# Patient Record
Sex: Male | Born: 1980 | Race: Black or African American | Hispanic: No | Marital: Married | Smoking: Current every day smoker
Health system: Southern US, Community
[De-identification: ages and names within clinical notes are randomized; demographics above are authoritative.]

---

## 2006-11-02 ENCOUNTER — Emergency Department: Payer: Self-pay | Admitting: Emergency Medicine

## 2007-07-20 ENCOUNTER — Emergency Department: Payer: Self-pay | Admitting: Emergency Medicine

## 2009-06-07 ENCOUNTER — Emergency Department: Payer: Self-pay | Admitting: Emergency Medicine

## 2009-09-25 ENCOUNTER — Emergency Department: Payer: Self-pay | Admitting: Unknown Physician Specialty

## 2012-02-07 ENCOUNTER — Emergency Department: Payer: Self-pay | Admitting: Emergency Medicine

## 2012-02-09 ENCOUNTER — Emergency Department: Payer: Self-pay | Admitting: Internal Medicine

## 2012-08-29 ENCOUNTER — Emergency Department: Payer: Self-pay | Admitting: Emergency Medicine

## 2013-02-04 ENCOUNTER — Emergency Department: Payer: Self-pay | Admitting: Unknown Physician Specialty

## 2013-12-02 ENCOUNTER — Emergency Department: Payer: Self-pay | Admitting: Emergency Medicine

## 2014-10-21 ENCOUNTER — Emergency Department: Payer: Self-pay | Admitting: Emergency Medicine

## 2015-03-05 ENCOUNTER — Encounter: Payer: Self-pay | Admitting: Emergency Medicine

## 2015-03-05 ENCOUNTER — Emergency Department
Admission: EM | Admit: 2015-03-05 | Discharge: 2015-03-05 | Disposition: A | Discharge status: Home / Self Care | Payer: 59 | Attending: Emergency Medicine | Admitting: Emergency Medicine

## 2015-03-05 DIAGNOSIS — Z720 Tobacco use: Secondary | ICD-10-CM | POA: Diagnosis not present

## 2015-03-05 DIAGNOSIS — R42 Dizziness and giddiness: Secondary | ICD-10-CM | POA: Insufficient documentation

## 2015-03-05 DIAGNOSIS — R51 Headache: Secondary | ICD-10-CM | POA: Insufficient documentation

## 2015-03-05 DIAGNOSIS — R519 Headache, unspecified: Secondary | ICD-10-CM

## 2015-03-05 MED ORDER — IBUPROFEN 800 MG PO TABS
800.0000 mg | ORAL_TABLET | Freq: Once | ORAL | Status: AC
  Administered 2015-03-05: 800 mg via ORAL

## 2015-03-05 MED ORDER — BUTALBITAL-APAP-CAFFEINE 50-325-40 MG PO TABS: 1.0000 | ORAL_TABLET | Freq: Four times a day (QID) | ORAL | Status: DC | PRN

## 2015-03-05 MED ORDER — IBUPROFEN 800 MG PO TABS
ORAL_TABLET | ORAL | Status: AC
  Administered 2015-03-05: 800 mg via ORAL
  Filled 2015-03-05: qty 1

## 2015-03-05 MED ORDER — TRAMADOL HCL 50 MG PO TABS
50.0000 mg | ORAL_TABLET | Freq: Once | ORAL | Status: AC
  Administered 2015-03-05: 50 mg via ORAL

## 2015-03-05 MED ORDER — TRAMADOL HCL 50 MG PO TABS
ORAL_TABLET | ORAL | Status: AC
  Administered 2015-03-05: 50 mg via ORAL
  Filled 2015-03-05: qty 1

## 2015-03-05 NOTE — ED Notes (Signed)
Patient states that he has had a headache times a week and half. Patient reports that he has taken over the counter medications with no improvement.

## 2015-03-05 NOTE — ED Provider Notes (Signed)
[facility] Emergency Department Provider Note  ____________________________________________  Time seen: Approximately 9:00 PM  I have reviewed the triage vital signs and the nursing notes.   HISTORY  Chief Complaint Headache    HPI Noah Lozano is a 34 y.o. male patient state he has had intermittent headache for over a week. Patient stated times the headache cause lightheadedness. Patient denies any nausea vomiting with this headache. He stated intermittent photosensitivity. Stated headache does not seem to be relieved for over-the-counter medication consisting of Aleve and Tylenol. Patient states the headache is on the right side radiating to the right occipital area. Denies any acute vision changes. Patient state he has been able to work with a headache. Patient denies any URI signs and symptoms he rate his pain as a 4/10 and described the headache as a pressure to dull.   No past medical history on file.  There are no active problems to display for this patient.   No past surgical history on file.  Current Outpatient Rx  Name  Route  Sig  Dispense  Refill  . butalbital-acetaminophen-caffeine (FIORICET) 50-325-40 MG per tablet   Oral   Take 1-2 tablets by mouth every 6 (six) hours as needed for headache.   20 tablet   0     Allergies Review of patient's allergies indicates no known allergies.  History reviewed. No pertinent family history.  Social History History  Substance Use Topics  . Smoking status: Current Every Day Smoker -- 0.50 packs/day for 10 years  . Smokeless tobacco: Not on file  . Alcohol Use: No    Review of Systems Constitutional: No fever/chills Eyes: No visual changes. ENT: No sore throat. Cardiovascular: Denies chest pain. Respiratory: Denies shortness of breath. Gastrointestinal: No abdominal pain.  No nausea, no vomiting.  No diarrhea.  No constipation. Genitourinary: Negative for dysuria. Musculoskeletal:  Negative for back pain. Skin: Negative for rash. Neurological: Positive headache without, focal weakness or numbness. { 10-point ROS otherwise negative.  ____________________________________________   PHYSICAL EXAM:  VITAL SIGNS: ED Triage Vitals  Enc Vitals Group     BP 03/05/15 2015 132/78 mmHg     Pulse Rate 03/05/15 2015 60     Resp 03/05/15 2015 18     Temp 03/05/15 2015 98.1 F (36.7 C)     Temp Source 03/05/15 2015 Oral     SpO2 03/05/15 2015 99 %     Weight 03/05/15 2015 195 lb (88.451 kg)     Height 03/05/15 2015  (1.727 m)     Head Cir --      Peak Flow --      Pain Score 03/05/15 2016 4     Pain Loc --      Pain Edu? --      Excl. in GC? --     Constitutional: Alert and oriented. Well appearing and in no acute distress. Eyes: Conjunctivae are normal. PERRL. EOMI. Head: Atraumatic. Nose: No congestion/rhinnorhea. Mouth/Throat: Mucous membranes are moist.  Oropharynx non-erythematous. Neck: No stridor.  No deformity for nuchal range of motion nontender palpation. Hematological/Lymphatic/Immunilogical: No cervical lymphadenopathy. Cardiovascular: Normal rate, regular rhythm. Grossly normal heart sounds.  Good peripheral circulation. Respiratory: Normal respiratory effort.  No retractions. Lungs CTAB. Gastrointestinal: Soft and nontender. No distention. No abdominal bruits. No CVA tenderness. Musculoskeletal: No lower extremity tenderness nor edema.  No joint effusions. Neurologic:  Normal speech and language. No gross focal neurologic deficits are appreciated. Speech is normal. No gait instability. Skin:  Skin is warm, dry and intact. No rash noted. Psychiatric: Mood and affect are normal. Speech and behavior are normal.  ____________________________________________   LABS (all labs ordered are listed, but only abnormal results are displayed)  Labs Reviewed - No data to  display ____________________________________________  EKG   ____________________________________________  RADIOLOGY   ____________________________________________   PROCEDURES  Procedure(s) performed: None  Critical Care performed: No  ____________________________________________   INITIAL IMPRESSION / ASSESSMENT AND PLAN / ED COURSE  Pertinent labs & imaging results that were available during my care of the patient were reviewed by me and considered in my medical decision making (see chart for details).  Nonspecific headache ____________________________________________   FINAL CLINICAL IMPRESSION(S) / ED DIAGNOSES  Final diagnoses:  Headache in back of head      Joni ReiningRonald K Smith, PA-C 03/05/15 2110  Phineas SemenGraydon Goodman, MD 03/05/15 2144

## 2015-03-05 NOTE — ED Notes (Signed)
Denies nausea or vomiting with headache.  Hurts so bad causes lightheadness. Photosensitivity present. Taken Ibuprofen, Aleeve, Tylenol over the past week but no relief

## 2019-01-09 ENCOUNTER — Other Ambulatory Visit: Payer: Self-pay

## 2019-01-09 ENCOUNTER — Encounter: Payer: Self-pay | Admitting: Emergency Medicine

## 2019-01-09 ENCOUNTER — Emergency Department
Admission: EM | Admit: 2019-01-09 | Discharge: 2019-01-09 | Disposition: A | Discharge status: Home / Self Care | Payer: 59 | Attending: Student in an Organized Health Care Education/Training Program | Admitting: Student in an Organized Health Care Education/Training Program

## 2019-01-09 ENCOUNTER — Emergency Department: Payer: 59

## 2019-01-09 DIAGNOSIS — Y939 Activity, unspecified: Secondary | ICD-10-CM | POA: Diagnosis not present

## 2019-01-09 DIAGNOSIS — R51 Headache: Secondary | ICD-10-CM | POA: Diagnosis present

## 2019-01-09 DIAGNOSIS — S01112A Laceration without foreign body of left eyelid and periocular area, initial encounter: Secondary | ICD-10-CM | POA: Insufficient documentation

## 2019-01-09 DIAGNOSIS — Y929 Unspecified place or not applicable: Secondary | ICD-10-CM | POA: Insufficient documentation

## 2019-01-09 DIAGNOSIS — Z23 Encounter for immunization: Secondary | ICD-10-CM | POA: Insufficient documentation

## 2019-01-09 DIAGNOSIS — S0181XA Laceration without foreign body of other part of head, initial encounter: Secondary | ICD-10-CM

## 2019-01-09 DIAGNOSIS — F17210 Nicotine dependence, cigarettes, uncomplicated: Secondary | ICD-10-CM | POA: Insufficient documentation

## 2019-01-09 DIAGNOSIS — Y999 Unspecified external cause status: Secondary | ICD-10-CM | POA: Diagnosis not present

## 2019-01-09 DIAGNOSIS — Z79899 Other long term (current) drug therapy: Secondary | ICD-10-CM | POA: Diagnosis not present

## 2019-01-09 DIAGNOSIS — S0990XA Unspecified injury of head, initial encounter: Secondary | ICD-10-CM | POA: Insufficient documentation

## 2019-01-09 MED ORDER — BUPIVACAINE HCL (PF) 0.5 % IJ SOLN
30.0000 mL | Freq: Once | INTRAMUSCULAR | Status: AC
  Administered 2019-01-09: 30 mL
  Filled 2019-01-09: qty 30

## 2019-01-09 MED ORDER — LIDOCAINE-EPINEPHRINE-TETRACAINE (LET) SOLUTION
3.0000 mL | Freq: Once | NASAL | Status: AC
  Administered 2019-01-09: 3 mL via TOPICAL
  Filled 2019-01-09: qty 3

## 2019-01-09 MED ORDER — TETANUS-DIPHTH-ACELL PERTUSSIS 5-2.5-18.5 LF-MCG/0.5 IM SUSP
0.5000 mL | Freq: Once | INTRAMUSCULAR | Status: AC
  Administered 2019-01-09: 0.5 mL via INTRAMUSCULAR
  Filled 2019-01-09: qty 0.5

## 2019-01-09 MED ORDER — FLUORESCEIN SODIUM 1 MG OP STRP
1.0000 | ORAL_STRIP | Freq: Once | OPHTHALMIC | Status: AC
  Administered 2019-01-09: 1 via OPHTHALMIC
  Filled 2019-01-09: qty 1
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
## 2019-01-24 ENCOUNTER — Telehealth: Payer: 59 | Admitting: Nurse Practitioner

## 2019-01-24 DIAGNOSIS — Z7189 Other specified counseling: Secondary | ICD-10-CM

## 2019-01-24 DIAGNOSIS — R059 Cough, unspecified: Secondary | ICD-10-CM

## 2019-01-24 DIAGNOSIS — R05 Cough: Secondary | ICD-10-CM

## 2019-01-24 MED ORDER — BENZONATATE 100 MG PO CAPS: 100.0000 mg | ORAL_CAPSULE | Freq: Three times a day (TID) | ORAL | 0 refills | Status: DC | PRN

## 2019-01-24 NOTE — Progress Notes (Signed)
E-Visit for Corona Virus Screening  Based on your current symptoms, you may very well have the virus, however your symptoms are mild. Currently, not all patients are being tested. If the symptoms are mild and there is not a known exposure, performing the test is not indicated.  Coronavirus disease 2019 (COVID-19) is a respiratory illness that can spread from person to person. The virus that causes COVID-19 is a new virus that was first identified in the country of [place] but is now found in multiple other countries and has spread to the [place].  Symptoms associated with the virus are mild to severe fever, cough, and shortness of breath. There is currently no vaccine to protect against COVID-19, and there is no specific antiviral treatment for the virus.   To be considered HIGH RISK for Coronavirus (COVID-19), you have to meet the following criteria:  . Traveled to [place], [place], [place], [place] or [place]; or in the [place] to [place], [place], [place], or [place]; and have fever, cough, and shortness of breath within the last 2 weeks of travel OR  . Been in close contact with a person diagnosed with COVID-19 within the last 2 weeks and have fever, cough, and shortness of breath  . IF YOU DO NOT MEET THESE CRITERIA, YOU ARE CONSIDERED LOW RISK FOR COVID-19.   It is vitally important that if you feel that you have an infection such as this virus or any other virus that you stay home and away from places where you may spread it to others.  You should self-quarantine for 14 days if you have symptoms that could potentially be coronavirus and avoid contact with people age 65 and older.   You can use medication such as A prescription cough medication called Tessalon Perles 100 mg. You may take 1-2 capsules every 8 hours as needed for cough  You may also take acetaminophen (Tylenol) as needed for fever.   Reduce your risk of any infection by using the same precautions used for  avoiding the common cold or flu:  . Wash your hands often with soap and warm water for at least 20 seconds.  If soap and water are not readily available, use an alcohol-based hand sanitizer with at least 60% alcohol.  . If coughing or sneezing, cover your mouth and nose by coughing or sneezing into the elbow areas of your shirt or coat, into a tissue or into your sleeve (not your hands). . Avoid shaking hands with others and consider head nods or verbal greetings only. . Avoid touching your eyes, nose, or mouth with unwashed hands.  . Avoid close contact with people who are sick. . Avoid places or events with large numbers of people in one location, like concerts or sporting events. . Carefully consider travel plans you have or are making. . If you are planning any travel outside or inside the [place], visit the CDC's Travelers' Health webpage for the latest health notices. . If you have some symptoms but not all symptoms, continue to monitor at home and seek medical attention if your symptoms worsen. . If you are having a medical emergency, call 911.  HOME CARE . Only take medications as instructed by your medical team. . Drink plenty of fluids and get plenty of rest. . A steam or ultrasonic humidifier can help if you have congestion.   GET HELP RIGHT AWAY IF: . You develop worsening fever. . You become short of breath . You cough   up blood. . Your symptoms become more severe MAKE SURE YOU   Understand these instructions.  Will watch your condition.  Will get help right away if you are not doing well or get worse.  Your e-visit answers were reviewed by a board certified advanced clinical practitioner to complete your personal care plan.  Depending on the condition, your plan could have included both over the counter or prescription medications.  If there is a problem please reply once you have received a response from your provider. Your safety is important to us.  If you have drug  allergies check your prescription carefully.    You can use MyChart to ask questions about today's visit, request a non-urgent call back, or ask for a work or school excuse for 24 hours related to this e-Visit. If it has been greater than 24 hours you will need to follow up with your provider, or enter a new e-Visit to address those concerns. You will get an e-mail in the next two days asking about your experience.  I hope that your e-visit has been valuable and will speed your recovery. Thank you for using e-visits.   5-10 minutes spent reviewing and documenting in chart.  
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
## 2024-09-05 ENCOUNTER — Emergency Department
Admission: EM | Admit: 2024-09-05 | Discharge: 2024-09-05 | Disposition: A | Discharge status: Home / Self Care | Payer: Self-pay | Attending: Emergency Medicine | Admitting: Emergency Medicine

## 2024-09-05 ENCOUNTER — Encounter: Payer: Self-pay | Admitting: Emergency Medicine

## 2024-09-05 ENCOUNTER — Other Ambulatory Visit: Payer: Self-pay

## 2024-09-05 DIAGNOSIS — L02412 Cutaneous abscess of left axilla: Secondary | ICD-10-CM | POA: Insufficient documentation

## 2024-09-05 MED ORDER — LIDOCAINE-EPINEPHRINE 2 %-1:100000 IJ SOLN
20.0000 mL | Freq: Once | INTRAMUSCULAR | Status: AC
  Administered 2024-09-05: 20 mL via INTRADERMAL

## 2024-09-05 NOTE — Discharge Instructions (Signed)
 You may remove the wick in 2 days.  Keep the area clean, wash with soap and water after the wick is removed.  Please return for any new, worsening, or changing symptoms or other concerns.  It was a pleasure caring for you today.

## 2024-09-05 NOTE — ED Provider Notes (Signed)
 [facility] Provider Note    Event Date/Time   First MD Initiated Contact with Patient 09/05/24 0830     (approximate)   History   Abscess   HPI  Noah Lozano is a 43 y.o. male who presents with pain to his left axilla.  Patient reports that he had an abscess here that popped on its own in November, though became painful again last night.  No fevers or chills.  There are no active problems to display for this patient.         Physical Exam   Triage Vital Signs: ED Triage Vitals [09/05/24 0828]  Encounter Vitals Group     BP (!) 162/103     Girls Systolic BP Percentile      Girls Diastolic BP Percentile      Boys Systolic BP Percentile      Boys Diastolic BP Percentile      Pulse Rate 75     Resp 18     Temp 98 F (36.7 C)     Temp src      SpO2 97 %     Weight 190 lb (86.2 kg)     Height 5' 9 (1.753 m)     Head Circumference      Peak Flow      Pain Score 8     Pain Loc      Pain Education      Exclude from Growth Chart     Most recent vital signs: Vitals:   09/05/24 0828  BP: (!) 162/103  Pulse: 75  Resp: 18  Temp: 98 F (36.7 C)  SpO2: 97%    Physical Exam Vitals and nursing note reviewed.  Constitutional:      General: Awake and alert. No acute distress.    Appearance: Normal appearance. The patient is normal weight.  HENT:     Head: Normocephalic and atraumatic.     Mouth: Mucous membranes are moist.  Eyes:     General: PERRL. Normal EOMs        Right eye: No discharge.        Left eye: No discharge.     Conjunctiva/sclera: Conjunctivae normal.  Cardiovascular:     Rate and Rhythm: Normal rate and regular rhythm.     Pulses: Normal pulses.     Heart sounds: Normal heart sounds Pulmonary:     Effort: Pulmonary effort is normal. No respiratory distress.     Breath sounds: Normal breath sounds.  Abdominal:     Abdomen is soft. There is no abdominal tenderness. No rebound or guarding. No  distention. Musculoskeletal:        General: No swelling. Normal range of motion.     Cervical back: Normal range of motion and neck supple.  Skin:    General: Skin is warm and dry.     Capillary Refill: Capillary refill takes less than 2 seconds.     Findings: Left axilla with 1 x 1 cm area of fluctuance without surrounding erythema.  Tender to palpation.  No open wounds.  No drainage.  No crepitus.  Full and normal range of motion of left shoulder. Neurological:     Mental Status: The patient is awake and alert.      ED Results / Procedures / Treatments   Labs (all labs ordered are listed, but only abnormal results are displayed) Labs Reviewed - No data to display   EKG     RADIOLOGY  PROCEDURES:  Critical Care performed:   .Incision and Drainage  Date/Time: 09/05/2024 10:46 AM  Performed by: Aneyah Lortz E, PA-C Authorized by: Chlora Mcbain E, PA-C   Consent:    Consent obtained:  Verbal   Consent given by:  Patient   Risks, benefits, and alternatives were discussed: yes     Risks discussed:  Bleeding, incomplete drainage, pain and damage to other organs   Alternatives discussed:  No treatment Universal protocol:    Procedure explained and questions answered to patient or proxy's satisfaction: yes     Relevant documents present and verified: yes     Test results available : yes     Required blood products, implants, devices, and special equipment available: yes     Site/side marked: yes     Immediately prior to procedure, a time out was called: yes     Patient identity confirmed:  Verbally with patient Location:    Type:  Abscess   Location:  Trunk   Trunk location: left axilla. Pre-procedure details:    Skin preparation:  Antiseptic wash Sedation:    Sedation type:  None Anesthesia:    Anesthesia method:  Local infiltration   Local anesthetic:  Lidocaine  1% WITH epi Procedure type:    Complexity:  Complex Procedure details:    Ultrasound  guidance: yes     Needle aspiration: no     Incision types:  Single straight   Incision depth:  Subcutaneous   Wound management:  Probed and deloculated, irrigated with saline and extensive cleaning   Drainage:  Purulent   Drainage amount:  Moderate   Wound treatment:  Drain placed   Packing materials:  1/4 in gauze Post-procedure details:    Procedure completion:  Tolerated well, no immediate complications .Ultrasound ED Soft Tissue  Date/Time: 09/05/2024 10:47 AM  Performed by: Salil Raineri E, PA-C Authorized by: Brylee Berk E, PA-C   Procedure details:    Indications: localization of abscess     Transverse view:  Not visualized   Longitudinal view:  Not visualized   Images: not archived     Visualization limited by: none. Location:    Location: axilla     Side:  Left Findings:     abscess present    no cellulitis present    no foreign body present    MEDICATIONS ORDERED IN ED: Medications  lidocaine -EPINEPHrine  (XYLOCAINE  W/EPI) 2 %-1:100000 (with pres) injection 20 mL (20 mLs Intradermal Given by Other 09/05/24 0857)     IMPRESSION / MDM / ASSESSMENT AND PLAN / ED COURSE  I reviewed the triage vital signs and the nursing notes.   Differential diagnosis includes, but is not limited to, abscess, cyst, cellulitis, folliculitis  Patient is awake and alert, hemodynamically stable and afebrile.  He has a small area of fluctuance to his left axilla without overlying erythema.  Ultrasound does reveal a small fluid collection.  Patient agreed to incision and drainage.  I&D performed with purulent output.  No need for antibiotics given lack of overlying cellulitis.  We discussed wound care and return precautions.  Also discussed timeline for wick removal.  Patient understands and agrees with plan.  He was discharged in stable condition.   Patient's presentation is most consistent with acute complicated illness / injury requiring diagnostic workup.      FINAL CLINICAL  IMPRESSION(S) / ED DIAGNOSES   Final diagnoses:  Abscess of left axilla     Rx / DC Orders   ED Discharge Orders  None        Note:  This document was prepared using Dragon voice recognition software and may include unintentional dictation errors.   Marques Ericson E, PA-C 09/05/24 1413    Willo Dunnings, MD 09/05/24 1426

## 2024-09-05 NOTE — ED Triage Notes (Signed)
 Patient presents with abscess under left armpit x 2 weeks. States it popped on thanksgiving and went down and is now swollen again.

## 2024-09-05 NOTE — ED Notes (Signed)
 See triage note  Presents with possible abscess area under left armpit  Hx of same  States this one started about 2 weeks ago  then became worse Afebrile on arrival
